# Patient Record
Sex: Female | Born: 1935 | Hispanic: No | Smoking: Never smoker
Health system: Southern US, Community
[De-identification: ages and names within clinical notes are randomized; demographics above are authoritative.]

## PROBLEM LIST (undated history)

## (undated) DIAGNOSIS — F0390 Unspecified dementia without behavioral disturbance: Secondary | ICD-10-CM

## (undated) DIAGNOSIS — F319 Bipolar disorder, unspecified: Secondary | ICD-10-CM

## (undated) HISTORY — DX: Unspecified dementia, unspecified severity, without behavioral disturbance, psychotic disturbance, mood disturbance, and anxiety: F03.90

## (undated) HISTORY — DX: Bipolar disorder, unspecified: F31.9

---

## 2020-09-30 ENCOUNTER — Emergency Department (HOSPITAL_COMMUNITY): Payer: Medicare Other

## 2020-09-30 ENCOUNTER — Emergency Department (HOSPITAL_COMMUNITY)
Admission: EM | Admit: 2020-09-30 | Discharge: 2020-09-30 | Disposition: A | Discharge status: Home / Self Care | Payer: Medicare Other | Attending: Emergency Medicine | Admitting: Emergency Medicine

## 2020-09-30 ENCOUNTER — Encounter (HOSPITAL_COMMUNITY): Payer: Self-pay

## 2020-09-30 DIAGNOSIS — R4182 Altered mental status, unspecified: Secondary | ICD-10-CM | POA: Diagnosis present

## 2020-09-30 DIAGNOSIS — Z79899 Other long term (current) drug therapy: Secondary | ICD-10-CM | POA: Insufficient documentation

## 2020-09-30 DIAGNOSIS — R140 Abdominal distension (gaseous): Secondary | ICD-10-CM | POA: Diagnosis not present

## 2020-09-30 DIAGNOSIS — W19XXXA Unspecified fall, initial encounter: Secondary | ICD-10-CM | POA: Insufficient documentation

## 2020-09-30 DIAGNOSIS — F0390 Unspecified dementia without behavioral disturbance: Secondary | ICD-10-CM | POA: Diagnosis not present

## 2020-09-30 DIAGNOSIS — R404 Transient alteration of awareness: Secondary | ICD-10-CM | POA: Diagnosis not present

## 2020-09-30 DIAGNOSIS — R030 Elevated blood-pressure reading, without diagnosis of hypertension: Secondary | ICD-10-CM | POA: Diagnosis not present

## 2020-09-30 LAB — CBC WITH DIFFERENTIAL/PLATELET
Abs Immature Granulocytes: 0.02 10*3/uL (ref 0.00–0.07)
Basophils Absolute: 0 10*3/uL (ref 0.0–0.1)
Basophils Relative: 1 %
Eosinophils Absolute: 0 10*3/uL (ref 0.0–0.5)
Eosinophils Relative: 1 %
HCT: 41.2 % (ref 36.0–46.0)
Hemoglobin: 13 g/dL (ref 12.0–15.0)
Immature Granulocytes: 0 %
Lymphocytes Relative: 37 %
Lymphs Abs: 2.3 10*3/uL (ref 0.7–4.0)
MCH: 29.6 pg (ref 26.0–34.0)
MCHC: 31.6 g/dL (ref 30.0–36.0)
MCV: 93.8 fL (ref 80.0–100.0)
Monocytes Absolute: 0.6 10*3/uL (ref 0.1–1.0)
Monocytes Relative: 10 %
Neutro Abs: 3.2 10*3/uL (ref 1.7–7.7)
Neutrophils Relative %: 51 %
Platelets: 186 10*3/uL (ref 150–400)
RBC: 4.39 MIL/uL (ref 3.87–5.11)
RDW: 15 % (ref 11.5–15.5)
WBC: 6.2 10*3/uL (ref 4.0–10.5)
nRBC: 0 % (ref 0.0–0.2)

## 2020-09-30 LAB — COMPREHENSIVE METABOLIC PANEL
ALT: 15 U/L (ref 0–44)
AST: 19 U/L (ref 15–41)
Albumin: 4.3 g/dL (ref 3.5–5.0)
Alkaline Phosphatase: 67 U/L (ref 38–126)
Anion gap: 6 (ref 5–15)
BUN: 16 mg/dL (ref 8–23)
CO2: 28 mmol/L (ref 22–32)
Calcium: 9.6 mg/dL (ref 8.9–10.3)
Chloride: 108 mmol/L (ref 98–111)
Creatinine, Ser: 0.57 mg/dL (ref 0.44–1.00)
GFR, Estimated: >60 mL/min (ref >60)
Glucose, Bld: 92 mg/dL (ref 70–99)
Potassium: 3.7 mmol/L (ref 3.5–5.1)
Sodium: 142 mmol/L (ref 135–145)
Total Bilirubin: 0.9 mg/dL (ref 0.3–1.2)
Total Protein: 6.6 g/dL (ref 6.5–8.1)

## 2020-09-30 LAB — URINALYSIS, COMPLETE (UACMP) WITH MICROSCOPIC
Bacteria, UA: NONE SEEN
Bilirubin Urine: NEGATIVE
Glucose, UA: NEGATIVE mg/dL
Hgb urine dipstick: NEGATIVE
Ketones, ur: 20 mg/dL — AB
Leukocytes,Ua: NEGATIVE
Nitrite: NEGATIVE
Protein, ur: NEGATIVE mg/dL
Specific Gravity, Urine: 1.019 (ref 1.005–1.030)
pH: 6 (ref 5.0–8.0)

## 2020-09-30 LAB — LACTIC ACID, PLASMA: Lactic Acid, Venous: 0.9 mmol/L (ref 0.5–1.9)

## 2020-09-30 LAB — AMMONIA: Ammonia: 14 umol/L (ref 9–35)

## 2020-09-30 LAB — RAPID URINE DRUG SCREEN, HOSP PERFORMED
Amphetamines: NOT DETECTED
Barbiturates: NOT DETECTED
Benzodiazepines: NOT DETECTED
Cocaine: NOT DETECTED
Opiates: NOT DETECTED
Tetrahydrocannabinol: NOT DETECTED

## 2020-09-30 LAB — CBG MONITORING, ED: Glucose-Capillary: 71 mg/dL (ref 70–99)

## 2020-09-30 LAB — CK: Total CK: 28 U/L — ABNORMAL LOW (ref 38–234)

## 2020-09-30 LAB — ETHANOL: Alcohol, Ethyl (B): <10 mg/dL (ref <10)

## 2020-09-30 NOTE — ED Triage Notes (Signed)
Pt arrived via EMS, from [facility], found unresponsive by staff this morning, sitting up on floor, unresponsive to verbal stimuli, responding to painful stimuli only. Responding only to sternal rubs in triage. Maintaining own airway.  CBG via EMS 118

## 2020-09-30 NOTE — ED Notes (Signed)
Spo2 down to 78%, pt crying. Placed on 3L [place], back up to 95% on o2.

## 2020-09-30 NOTE — ED Provider Notes (Signed)
[facility]-EMERGENCY DEPT Provider Note   CSN: [id number] Arrival date & time: 09/30/20  7510     History Chief Complaint  Patient presents with  . Altered Mental Status    Kaitlin Lucas is a 86 y.o. female brought in by EMS from [facility] with [facility].  She has no past medical history in our system, no contact information and unfortunately was sent without any paperwork.  According to EMS the patient was found sitting in her room on the floor for an unknown amount of time covered in stool.  She seems to be less responsive however other than a history of dementia nothing else is known about her baseline.  There was medication from EMS to her nurse that apparently her family says she does this from time to time however I have no idea if family was present at bedside.  I was able to contact the facility and ask them to fax over her medical records, a copy of today's events, and contact info.  HPI     Past Medical History:  Diagnosis Date  . Bipolar 1 disorder (HCC)   . Dementia (HCC)     There are no problems to display for this patient.   History reviewed. No pertinent surgical history.   OB History   No obstetric history on file.     History reviewed. No pertinent family history.  Social History   Tobacco Use  . Smoking status: Never Smoker  . Smokeless tobacco: Never Used    Home Medications Prior to Admission medications   Medication Sig Start Date End Date Taking? Authorizing Provider  donepezil (ARICEPT) 10 MG tablet Take 10 mg by mouth at bedtime. 09/08/20  Yes [provider]  memantine (NAMENDA) 5 MG tablet Take 5 mg by mouth 2 (two) times daily. 09/08/20  Yes [provider]  Multiple Vitamins-Minerals (PRESERVISION AREDS) CAPS Take 1 tablet by mouth daily.   Yes [provider]  OLANZapine (ZYPREXA) 5 MG tablet Take 5 mg by mouth at bedtime. 09/08/20  Yes [provider]  OLANZapine (ZYPREXA) 5 MG tablet Take 2.5 mg by mouth every 8 (eight) hours as needed for agitation. 08/07/20  Yes [provider]  Skin Protectants, Misc. (BAZA PROTECT EX) Apply 1 application topically 3 (three) times daily.   Yes [provider]  traZODone (DESYREL) 100 MG tablet Take 100 mg by mouth at bedtime. 08/07/17  Yes [provider]    Allergies    Penicillins  Review of Systems   Review of Systems Unable to review systems due to the dementia Physical Exam Updated Vital Signs BP (!) 190/179   Pulse (!) 58   Temp 98.6 F (37 C) (Rectal)   Resp 20   SpO2 100%   Physical Exam Vitals and nursing note reviewed.  Constitutional:      General: She is not in acute distress.    Appearance: She is well-developed and well-nourished. She is not diaphoretic.     Comments: Overwhelming foul odor upon entering the room  HENT:     Head: Normocephalic and atraumatic.  Eyes:     General: No scleral icterus.    Conjunctiva/sclera: Conjunctivae normal.     Pupils: Pupils are equal, round, and reactive to light.     Comments: Crusting and lash mattering noted around the left eye  Cardiovascular:     Rate and Rhythm: Normal rate and regular rhythm.  Heart sounds: Normal heart sounds. No murmur heard. No friction rub. No gallop.   Pulmonary:     Effort: Pulmonary effort is normal. No respiratory distress.     Breath sounds: Normal breath sounds.  Abdominal:     General: Bowel sounds are normal. There is distension.     Palpations: Abdomen is soft. There is no mass.     Tenderness: There is no abdominal tenderness. There is no guarding.  Musculoskeletal:        General: No swelling, deformity or signs of injury.     Cervical back: Normal range of motion.     Right lower leg: No edema.     Left lower leg: No edema.  Skin:    General: Skin is warm and dry.     Comments: No obvious wounds  Neurological:     GCS: GCS eye subscore is  2. GCS verbal subscore is 1. GCS motor subscore is 4.     ED Results / Procedures / Treatments   Labs (all labs ordered are listed, but only abnormal results are displayed) Labs Reviewed  URINALYSIS, COMPLETE (UACMP) WITH MICROSCOPIC - Abnormal; Notable for the following components:      Result Value   Ketones, ur 20 (*)    All other components within normal limits  CK - Abnormal; Notable for the following components:   Total CK 28 (*)    All other components within normal limits  CULTURE, BLOOD (ROUTINE X 2)  CULTURE, BLOOD (ROUTINE X 2)  COMPREHENSIVE METABOLIC PANEL  CBC WITH DIFFERENTIAL/PLATELET  AMMONIA  LACTIC ACID, PLASMA  RAPID URINE DRUG SCREEN, HOSP PERFORMED  ETHANOL  CBG MONITORING, ED    EKG EKG Interpretation  Date/Time:  Saturday September 30 2020 09:54:50 EST Ventricular Rate:  63 PR Interval:    QRS Duration: 93 QT Interval:  426 QTC Calculation: 437 R Axis:   16 Text Interpretation: Sinus rhythm Atrial premature complex Probable left atrial enlargement RSR' in V1 or V2, probably normal variant Left ventricular hypertrophy No old tracing to compare Confirmed by Mancel BaleWentz, Elliott [phone]([id number]) on 09/30/2020 10:06:59 AM   Radiology DG Pelvis 1-2 Views  Result Date: 09/30/2020 CLINICAL DATA:  Found on the floor, concern for unwitnessed fall, dementia EXAM: PELVIS - 1-2 VIEW COMPARISON:  None. FINDINGS: Previous right total hip arthroplasty. Components appear aligned in the frontal plane. Bones are severely osteopenic. No definite displaced fracture. Degenerative changes of SI joints bilaterally. Left hip appears grossly intact. Degenerative changes noted of the lumbosacral spine as well. IMPRESSION: Degenerative changes and osteopenia. No acute displaced fracture by plain radiography. Electronically Signed   By: Judie PetitM.  Shick M.D.   On: 09/30/2020 10:57   CT HEAD WO CONTRAST  Result Date: 09/30/2020 CLINICAL DATA:  Mental status change, unknown cause EXAM: CT HEAD  WITHOUT CONTRAST TECHNIQUE: Contiguous axial images were obtained from the base of the skull through the vertex without intravenous contrast. COMPARISON:  None. FINDINGS: Brain: No acute infarct or intracranial hemorrhage. No mass lesion. No midline shift, ventriculomegaly or extra-axial fluid collection. Mild cerebral atrophy with ex vacuo dilatation. Scattered and confluent supratentorial white matter hypodensities are nonspecific however commonly associated with chronic microvascular ischemic changes. Vascular: No hyperdense vessel or unexpected calcification. Skull: No acute finding. Sinuses/Orbits: No acute orbital finding. Small right maxillary sinus mucous retention cyst. Other: None. IMPRESSION: No acute intracranial process. Mild cerebral atrophy and chronic microvascular ischemic changes. Electronically Signed   By: Stana Buntinghikanele  Emekauwa M.D.   On: 09/30/2020  10:20   DG Chest Port 1 View  Result Date: 09/30/2020 CLINICAL DATA:  Altered mental status. EXAM: PORTABLE CHEST 1 VIEW COMPARISON:  None. FINDINGS: Mild cardiomegaly and central pulmonary vascular congestion may be present. No pneumothorax or pleural effusion is noted. No consolidative process is noted. Bony thorax is unremarkable. IMPRESSION: Mild cardiomegaly and central pulmonary vascular congestion may be present. Electronically Signed   By: Lupita Raider M.D.   On: 09/30/2020 10:05    Procedures Procedures (including critical care time)  Medications Ordered in ED Medications - No data to display  ED Course  I have reviewed the triage vital signs and the nursing notes.  Pertinent labs & imaging results that were available during my care of the patient were reviewed by me and considered in my medical decision making (see chart for details).  Clinical Course as of 09/30/20 1715  Sat Sep 30, 2020  1104 BP(!): 227/114 [AH]  1118 I discussed labs and imaging with the patient's daughter at bedside.The patient is currently at  baseline and is responding to questions appropriately. Her daughter, who is a retired Engineer, civil (consulting), thinks she may have something like absence seizures but is currently back to baseline and answering questions. Her highest recorded blood pressure was at the time when she was crying.  Daughter states that she feels comfortable with discharge to SNF even with her BP elevated.  [AH]    Clinical Course User Index [AH] Arthor Captain, PA-C   MDM Rules/Calculators/A&P                           CC:ams VS: BP (!) 190/179   Pulse (!) 58   Temp 98.6 F (37 C) (Rectal)   Resp 20   SpO2 100%   BT:DVVOHYW is gathered by daughter at [facility]ide and [facility] by fax. Previous records obtained and reviewed. DDX:The patient's complaint of ams involves an extensive number of diagnostic and treatment options, and is a complaint that carries with it a high risk of complications, morbidity, and potential mortality. Given the large differential diagnosis, medical decision making is of high complexity. The differential diagnosis for AMS is extensive and includes, but is not limited to: drug overdose - opioids, alcohol, sedatives, antipsychotics, drug withdrawal, others; Metabolic: hypoxia, hypoglycemia, hyperglycemia, hypercalcemia, hypernatremia, hyponatremia, uremia, hepatic encephalopathy, hypothyroidism, hyperthyroidism, vitamin B12 or thiamine deficiency, carbon monoxide poisoning, Wilson's disease, Lactic acidosis, DKA/HHOS; Infectious: meningitis, encephalitis, bacteremia/sepsis, urinary tract infection, pneumonia, neurosyphilis; Structural: Space-occupying lesion, (brain tumor, subdural hematoma, hydrocephalus,); Vascular: stroke, subarachnoid hemorrhage, coronary ischemia, hypertensive encephalopathy, CNS vasculitis, thrombotic thrombocytopenic purpura, disseminated intravascular coagulation, hyperviscosity; Psychiatric: Schizophrenia, depression; Other: Seizure, hypothermia, heat stroke, ICU psychosis, dementia  -"sundowning."   Labs: I ordered reviewed and interpreted labs which include CBC and CMP without abnormality, lactic acid within normal limits, normal CBG, UA without evidence of infection, normal ammonia, negative UDS, CK below normal values, ethanol within normal limits  Imaging: I ordered and reviewed images which included CT head, 1-2 view pelvis and portable 1 view chest x-ray. I independently visualized and interpreted all imaging. There are no acute, significant findings on today's images. EKG: EKG shows normal sinus rhythm at a rate of 63 Consults: MDM: Patient here with transient altered mental status.  This seems to be something patient has done in the past.  Her daughter states that she has only been in the memory care unit for 1 month and thinks she is having some difficulty transitioning.  She  states that if she does fall to the ground she is unable to get herself back up.  She has no evidence of fracture on her imaging, no evidence of head bleed.  Patient had a manual blood pressure of 138/80 at bedside.  I think there was some problem with the readings of hypertension on the monitor.  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  Patient will be discharged back to her skilled nursing facility.  This was discussed with the daughter at bedside who is in agreement. Patient disposition:The patient appears reasonably screened and/or stabilized for discharge and I doubt any other medical condition or other [facility] requiring further screening, evaluation, or treatment in the ED at this time prior to discharge. I have discussed lab and/or imaging findings with the patient and answered all questions/concerns to the best of my ability.I have discussed return precautions and OP follow up.    Final Clinical Impression(s) / ED Diagnoses Final diagnoses:  Transient alteration of awareness  Fall, initial encounter  Elevated blood pressure reading    Rx / DC Orders ED  Discharge Orders    None       Arthor Captain, PA-C 09/30/20 1717    Mancel Bale, MD 09/30/20 2033

## 2020-09-30 NOTE — ED Provider Notes (Signed)
°  Face-to-face evaluation   History: She reportedly presents for period of unresponsiveness, at her facility this morning.  She is unable to give history.  Patient's daughter in room at 10:05 AM, and states that she feels the patient is at her baseline, able to verbalize and recognize the daughter.  Physical exam: Patient is alert, and responsive.  Skin is somewhat moist and warm.  Abdomen soft and nontender to palpation.  No respiratory distress.  Extremities without edema, and normal range of motion of the hips bilaterally without pain.  Procedures   Medical screening examination/treatment/procedure(s) were conducted as a shared visit with non-physician practitioner(s) and myself.  I personally evaluated the patient during the encounter    Mancel Bale, MD 09/30/20 2033

## 2020-09-30 NOTE — ED Notes (Signed)
Transport called.

## 2020-09-30 NOTE — Discharge Instructions (Signed)
Please follow closely on patient's blood pressure readings

## 2020-09-30 NOTE — ED Notes (Signed)
Pt family at bedside

## 2020-10-06 LAB — CULTURE, BLOOD (ROUTINE X 2)
Culture: NO GROWTH
Culture: NO GROWTH
Special Requests: ADEQUATE
Special Requests: ADEQUATE

## 2020-10-21 DEATH — deceased

## 2021-12-28 IMAGING — CT CT HEAD W/O CM
3 series · 14 of 47 positions shown, 16 images · non-contrast
Comparison: None.

CLINICAL DATA: Mental status change, unknown cause

EXAM:
CT HEAD WITHOUT CONTRAST
TECHNIQUE: Contiguous axial images were obtained from the base of the skull
through the vertex without intravenous contrast.

[Series 2: head wo · axial · 0.47mm/px · z∈[-148,-13]mm · 8 of 33 slices shown, 10 images]
[im 3/33  brain]
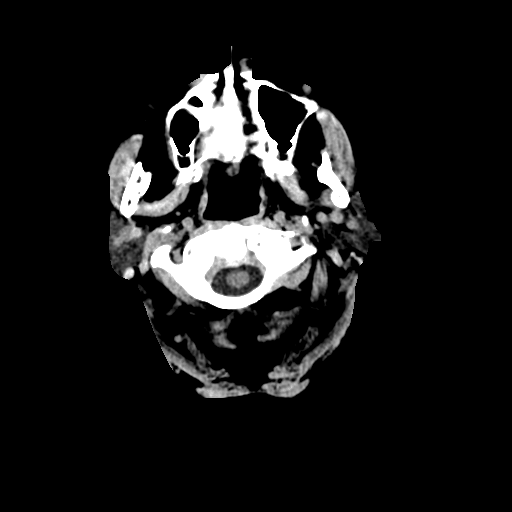
[im 3/33  bone]
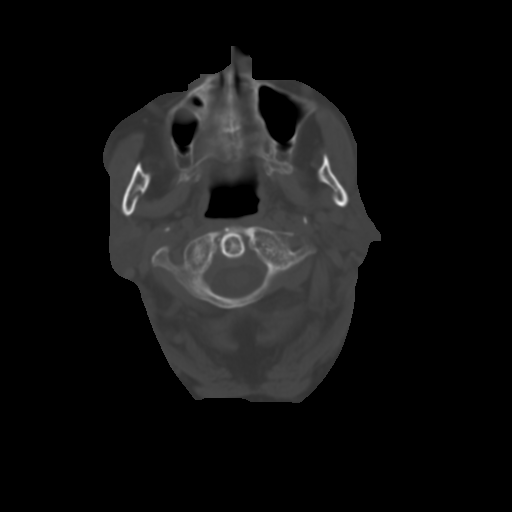
[im 7/33  brain]
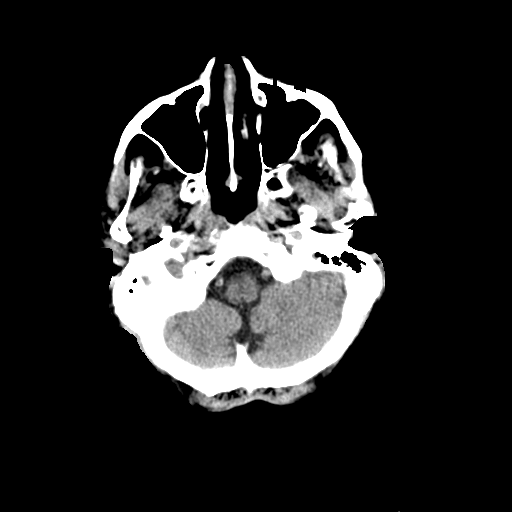
[im 10/33  brain]
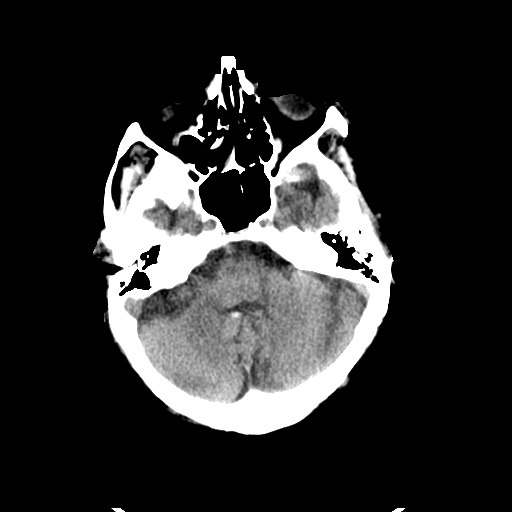
[im 15/33  brain]
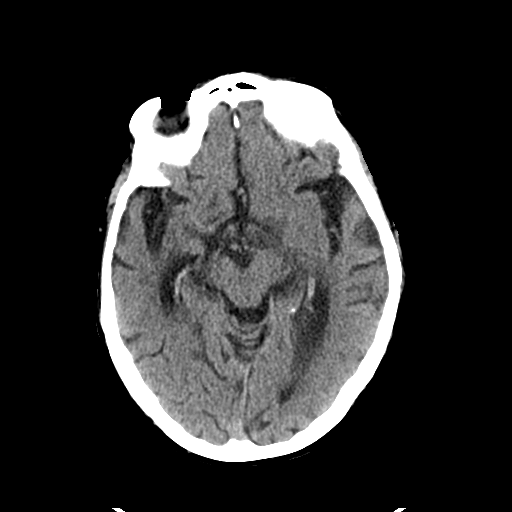
[im 18/33  brain]
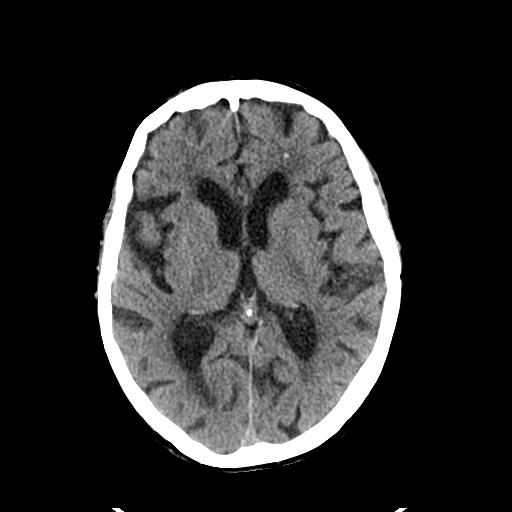
[im 18/33  bone]
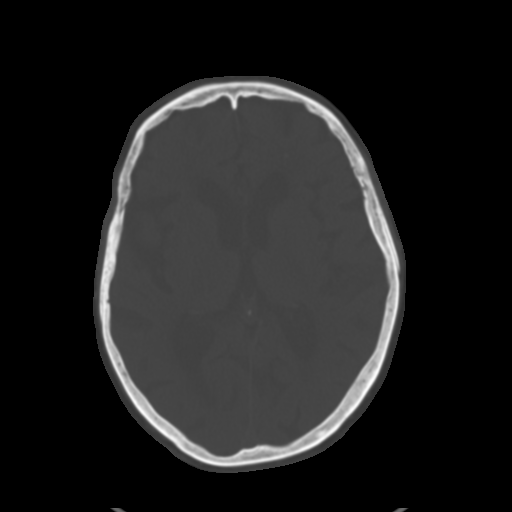
[im 23/33  brain]
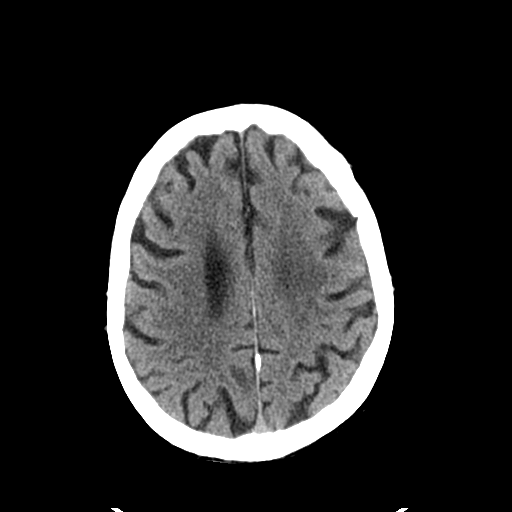
[im 26/33  brain]
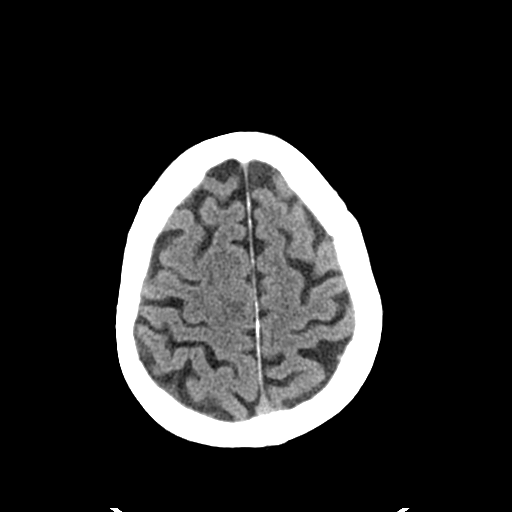
[im 30/33  brain]
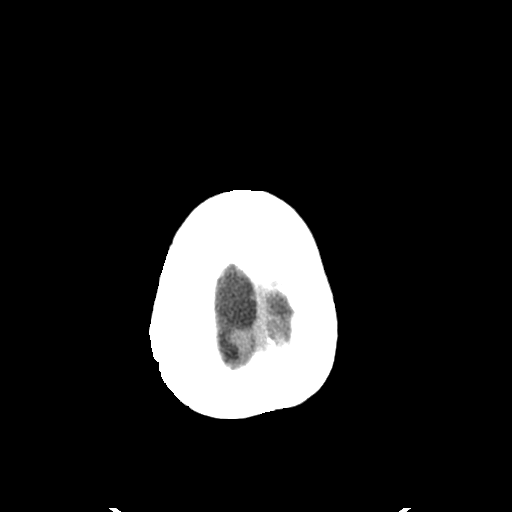

[Series 5: coronal soft tissue · coronal · 0.35mm/px · 3 of 66 slices shown]
[im 22/66  brain]
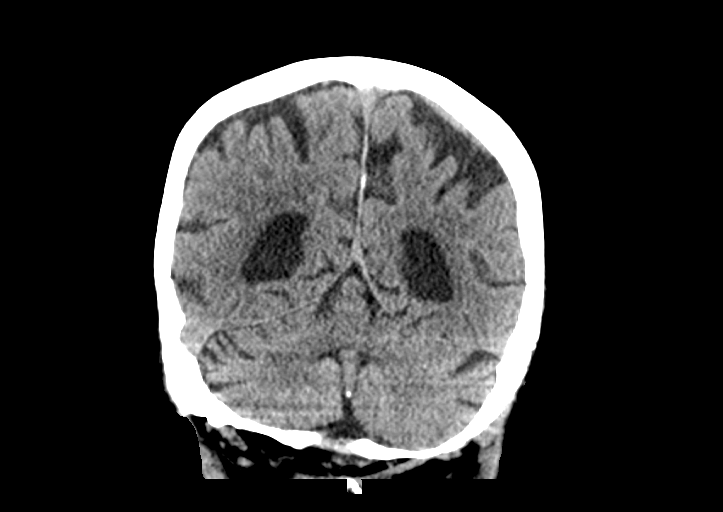
[im 29/66  brain]
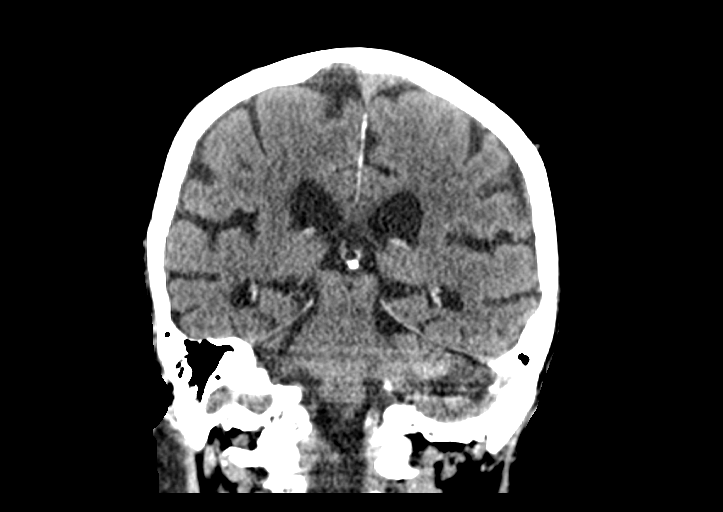
[im 37/66  brain]
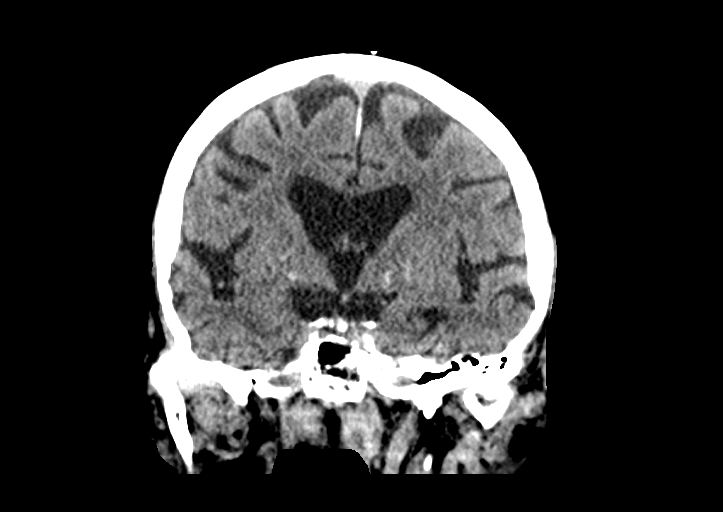

[Series 6: sagittal soft tissue · sagittal · 0.34mm/px · 3 of 54 slices shown]
[im 18/54  brain]
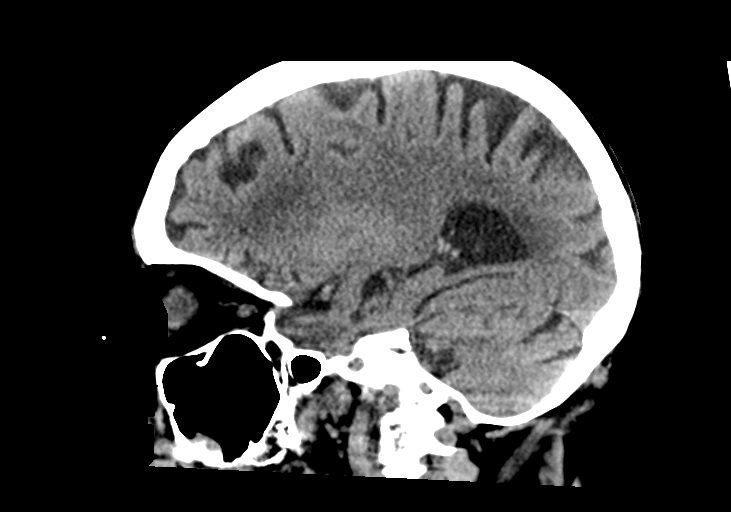
[im 27/54  brain]
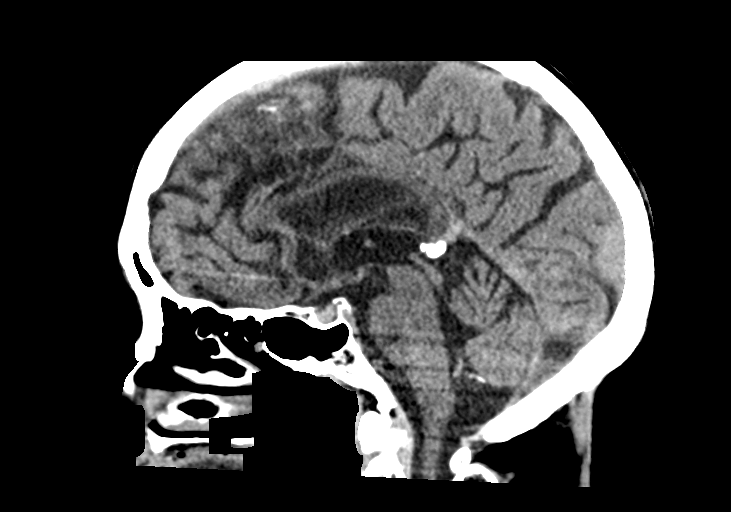
[im 36/54  brain]
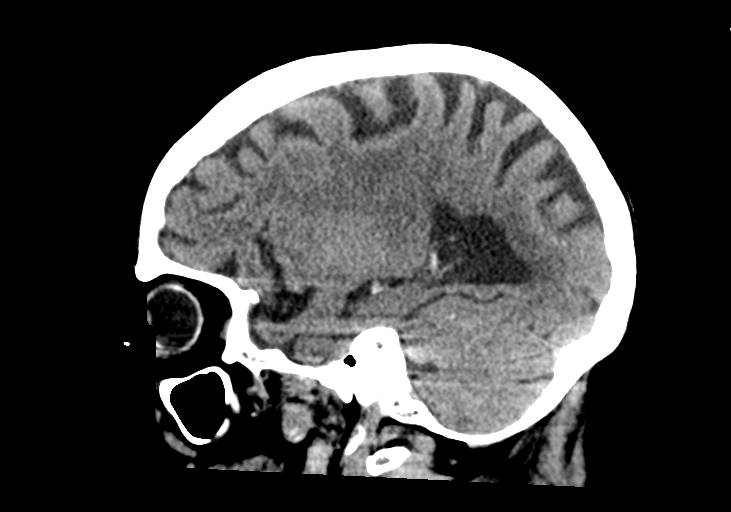

[14 of 47 positions shown; findings below may reference images not displayed]

FINDINGS: Brain: No acute infarct or intracranial hemorrhage. No mass lesion.
No midline shift, ventriculomegaly or extra-axial fluid collection.
Mild cerebral atrophy with ex vacuo dilatation. Scattered and
confluent supratentorial white matter hypodensities are nonspecific
however commonly associated with chronic microvascular ischemic
changes.

Vascular: No hyperdense vessel or unexpected calcification.

Skull: No acute finding.

Sinuses/Orbits: No acute orbital finding. Small right maxillary
sinus mucous retention cyst.

Other: None.
IMPRESSION: No acute intracranial process.

Mild cerebral atrophy and chronic microvascular ischemic changes.
